# Patient Record
Sex: Male | Born: 1970 | Hispanic: Yes | Marital: Married | Smoking: Never smoker
Health system: Southern US, Community
[De-identification: ages and names within clinical notes are randomized; demographics above are authoritative.]

## PROBLEM LIST (undated history)

## (undated) DIAGNOSIS — Z8619 Personal history of other infectious and parasitic diseases: Secondary | ICD-10-CM

## (undated) DIAGNOSIS — I10 Essential (primary) hypertension: Secondary | ICD-10-CM

## (undated) DIAGNOSIS — E119 Type 2 diabetes mellitus without complications: Secondary | ICD-10-CM

## (undated) HISTORY — PX: KNEE SURGERY: SHX244

---

## 2014-01-01 ENCOUNTER — Ambulatory Visit: Payer: Self-pay | Admitting: Orthopedic Surgery

## 2018-09-24 ENCOUNTER — Other Ambulatory Visit: Payer: Self-pay | Admitting: Internal Medicine

## 2018-09-24 DIAGNOSIS — R1909 Other intra-abdominal and pelvic swelling, mass and lump: Secondary | ICD-10-CM

## 2018-09-24 DIAGNOSIS — N5089 Other specified disorders of the male genital organs: Secondary | ICD-10-CM

## 2018-09-25 ENCOUNTER — Other Ambulatory Visit: Payer: Self-pay

## 2018-09-25 ENCOUNTER — Other Ambulatory Visit: Payer: Self-pay | Admitting: Internal Medicine

## 2018-09-25 ENCOUNTER — Ambulatory Visit
Admission: RE | Admit: 2018-09-25 | Discharge: 2018-09-25 | Disposition: A | Discharge status: Home / Self Care | Payer: PRIVATE HEALTH INSURANCE | Source: Ambulatory Visit | Attending: Internal Medicine | Admitting: Internal Medicine

## 2018-09-25 DIAGNOSIS — N5089 Other specified disorders of the male genital organs: Secondary | ICD-10-CM | POA: Insufficient documentation

## 2018-09-25 DIAGNOSIS — R1909 Other intra-abdominal and pelvic swelling, mass and lump: Secondary | ICD-10-CM | POA: Insufficient documentation

## 2018-10-28 ENCOUNTER — Other Ambulatory Visit: Payer: Self-pay | Admitting: Internal Medicine

## 2018-10-28 DIAGNOSIS — R945 Abnormal results of liver function studies: Secondary | ICD-10-CM

## 2018-10-28 DIAGNOSIS — R7989 Other specified abnormal findings of blood chemistry: Secondary | ICD-10-CM

## 2018-12-09 ENCOUNTER — Other Ambulatory Visit: Payer: Self-pay

## 2018-12-09 ENCOUNTER — Ambulatory Visit
Admission: RE | Admit: 2018-12-09 | Discharge: 2018-12-09 | Disposition: A | Discharge status: Home / Self Care | Payer: PRIVATE HEALTH INSURANCE | Source: Ambulatory Visit | Attending: Internal Medicine | Admitting: Internal Medicine

## 2018-12-09 ENCOUNTER — Ambulatory Visit: Admission: RE | Admit: 2018-12-09 | Payer: PRIVATE HEALTH INSURANCE | Source: Ambulatory Visit

## 2018-12-09 DIAGNOSIS — R7989 Other specified abnormal findings of blood chemistry: Secondary | ICD-10-CM

## 2018-12-09 DIAGNOSIS — R945 Abnormal results of liver function studies: Secondary | ICD-10-CM

## 2019-01-05 ENCOUNTER — Emergency Department
Admission: EM | Admit: 2019-01-05 | Discharge: 2019-01-05 | Disposition: A | Discharge status: Home / Self Care | Payer: No Typology Code available for payment source | Attending: Student in an Organized Health Care Education/Training Program | Admitting: Student in an Organized Health Care Education/Training Program

## 2019-01-05 ENCOUNTER — Other Ambulatory Visit: Payer: Self-pay

## 2019-01-05 DIAGNOSIS — S0502XA Injury of conjunctiva and corneal abrasion without foreign body, left eye, initial encounter: Secondary | ICD-10-CM | POA: Insufficient documentation

## 2019-01-05 DIAGNOSIS — Y998 Other external cause status: Secondary | ICD-10-CM | POA: Insufficient documentation

## 2019-01-05 DIAGNOSIS — W228XXA Striking against or struck by other objects, initial encounter: Secondary | ICD-10-CM | POA: Insufficient documentation

## 2019-01-05 DIAGNOSIS — Y9389 Activity, other specified: Secondary | ICD-10-CM | POA: Insufficient documentation

## 2019-01-05 DIAGNOSIS — Y9289 Other specified places as the place of occurrence of the external cause: Secondary | ICD-10-CM | POA: Diagnosis not present

## 2019-01-05 DIAGNOSIS — S0592XA Unspecified injury of left eye and orbit, initial encounter: Secondary | ICD-10-CM | POA: Diagnosis present

## 2019-01-05 MED ORDER — FLUORESCEIN SODIUM 1 MG OP STRP
1.0000 | ORAL_STRIP | Freq: Once | OPHTHALMIC | Status: AC
  Administered 2019-01-05: 1 via OPHTHALMIC
  Filled 2019-01-05: qty 1

## 2019-01-05 MED ORDER — TETRACAINE HCL 0.5 % OP SOLN
2.0000 [drp] | Freq: Once | OPHTHALMIC | Status: AC
  Administered 2019-01-05: 2 [drp] via OPHTHALMIC
  Filled 2019-01-05: qty 4

## 2019-01-05 MED ORDER — POLYMYXIN B-TRIMETHOPRIM 10000-0.1 UNIT/ML-% OP SOLN: 1.0000 [drp] | OPHTHALMIC | 0 refills | Status: AC

## 2019-01-05 MED ORDER — KETOROLAC TROMETHAMINE 0.4 % OP SOLN: 1.0000 [drp] | Freq: Three times a day (TID) | OPHTHALMIC | 0 refills | Status: DC | PRN

## 2019-01-05 MED ORDER — POLYMYXIN B-TRIMETHOPRIM 10000-0.1 UNIT/ML-% OP SOLN
2.0000 [drp] | OPHTHALMIC | Status: DC
  Administered 2019-01-05: 2 [drp] via OPHTHALMIC
  Filled 2019-01-05: qty 10

## 2019-01-05 MED ORDER — KETOROLAC TROMETHAMINE 0.4 % OP SOLN: 1.0000 [drp] | Freq: Three times a day (TID) | OPHTHALMIC | 0 refills | Status: AC | PRN

## 2019-01-05 MED ORDER — POLYMYXIN B-TRIMETHOPRIM 10000-0.1 UNIT/ML-% OP SOLN: 1.0000 [drp] | OPHTHALMIC | 0 refills | Status: DC

## 2019-01-05 NOTE — ED Triage Notes (Signed)
Patient reports feels like something is in his left eye for the past 2 days.  Reports started after he was mowing.

## 2019-01-05 NOTE — ED Provider Notes (Signed)
[facility] Emergency Department Provider Note  ____________________________________________  Time seen: Approximately 8:04 AM  I have reviewed the triage vital signs and the nursing notes.   HISTORY  Chief Complaint Eye Problem    HPI Theodore Patel is a 48 y.o. male that presents to the emergency department for evaluation of eye irritation for 2 days.  Patient was mowing his lawn at the end of this week and he felt something fly into his eye.  He feels as if there is something in his eye.  Eye irritation is worse when his eyelid brushes up against his eye. Vision is a little blurry. He does not wear contacts.    He was also exposed to poison ivy this week, for which she is on oral medication and cream from primary care.   No past medical history on file.  There are no active problems to display for this patient.   Prior to Admission medications   Medication Sig Start Date End Date Taking? Authorizing Provider  ketorolac (ACULAR) 0.4 % SOLN Place 1 drop into the left eye 3 (three) times daily as needed. 01/05/19   [place] DerryWagner, Serita Degroote, PA-C  trimethoprim-polymyxin b (POLYTRIM) ophthalmic solution Place 1 drop into the left eye every 4 (four) hours. 01/05/19   [place] DerryWagner, Elaisha Zahniser, PA-C    Allergies Patient has no known allergies.  No family history on file.  Social History Social History   Tobacco Use  . Smoking status: Not on file  Substance Use Topics  . Alcohol use: Not on file  . Drug use: Not on file     Review of Systems  Respiratory: No SOB. Gastrointestinal: No nausea, no vomiting.  Musculoskeletal: Negative for musculoskeletal pain. Skin: Negative for rash, abrasions, lacerations, ecchymosis.   ____________________________________________   PHYSICAL EXAM:  VITAL SIGNS: ED Triage Vitals  Enc Vitals Group     BP 01/05/19 0638 (!) 152/92     Pulse Rate 01/05/19 0638 72     Resp 01/05/19 0638 18     Temp 01/05/19 0638 98.7 F (37.1 C)   Temp Source 01/05/19 0638 Oral     SpO2 01/05/19 0638 97 %     Weight 01/05/19 0636 210 lb (95.3 kg)     Height 01/05/19 0636 5\' 6"  (1.676 m)     Head Circumference --      Peak Flow --      Pain Score 01/05/19 0636 10     Pain Loc --      Pain Edu? --      Excl. in GC? --      Constitutional: Alert and oriented. Well appearing and in no acute distress. Eyes: Left conjunctiva is injected. PERRL. EOMI. circular 1 mm corneal defect to 7 PM position of left iris, not overlying the pupil. No indication for hordeolum, dacrocystitis, cellulitis, subconjunctival hemorrhage, corneal ulcer, hyphema, hypopyon. Head: Atraumatic. ENT:      Ears:      Nose: No congestion/rhinnorhea.      Mouth/Throat: Mucous membranes are moist.  Neck: No stridor. Cardiovascular: Normal rate, regular rhythm.  Good peripheral circulation. Respiratory: Normal respiratory effort without tachypnea or retractions. Lungs CTAB. Good air entry to the bases with no decreased or absent breath sounds. Musculoskeletal: Full range of motion to all extremities. No gross deformities appreciated. Neurologic:  Normal speech and language. No gross focal neurologic deficits are appreciated.  Skin:  Skin is warm, dry and intact. No rash noted. Psychiatric: Mood and affect are normal. Speech  and behavior are normal. Patient exhibits appropriate insight and judgement.   ____________________________________________   LABS (all labs ordered are listed, but only abnormal results are displayed)  Labs Reviewed - No data to display ____________________________________________  EKG   ____________________________________________  RADIOLOGY   No results found.  ____________________________________________    PROCEDURES  Procedure(s) performed:    Procedures    Medications  trimethoprim-polymyxin b (POLYTRIM) ophthalmic solution 2 drop (2 drops Left Eye Given 01/05/19 0839)  fluorescein ophthalmic strip 1 strip (1  strip Right Eye Given by Other 01/05/19 0836)  tetracaine (PONTOCAINE) 0.5 % ophthalmic solution 2 drop (2 drops Right Eye Given by Other 01/05/19 0836)     ____________________________________________   INITIAL IMPRESSION / ASSESSMENT AND PLAN / ED COURSE  Pertinent labs & imaging results that were available during my care of the patient were reviewed by me and considered in my medical decision making (see chart for details).  Review of the [place] CSRS was performed in accordance of the [place] prior to dispensing any controlled drugs.   Patient's diagnosis is consistent with corneal abrasion.  Vital signs and exam are reassuring.  Patient does have a corneal abrasion as seen on fluorescein stain.  Patient will be discharged home with prescriptions for polytrim. Patient is to follow up with opthamology as directed.  Referral was given to Dr. Neville Route.  Patient is agreeable to call today or Monday.  Patient is given ED precautions to return to the ED for any worsening or new symptoms.     ____________________________________________  FINAL CLINICAL IMPRESSION(S) / ED DIAGNOSES  Final diagnoses:  Abrasion of left cornea, initial encounter      NEW MEDICATIONS STARTED DURING THIS VISIT:  ED Discharge Orders         Ordered    trimethoprim-polymyxin b (POLYTRIM) ophthalmic solution  Every 4 hours     01/05/19 0830    ketorolac (ACULAR) 0.4 % SOLN  3 times daily PRN     01/05/19 0832              This chart was dictated using voice recognition software/Dragon. Despite best efforts to proofread, errors can occur which can change the meaning. Any change was purely unintentional.    Laban Emperor, PA-C 01/05/19 1539    Merlyn Lot, MD 01/05/19 [phone]

## 2019-01-05 NOTE — Discharge Instructions (Signed)
You have a scratch to your eye.  Please apply Polytrim eyedrops, 1 drop to your eye every 4 hours.  You can use Acular eye drops for pain.  Lease call Dr. Neville Route today or Monday for an appointment on Monday.

## 2019-01-05 NOTE — ED Notes (Signed)
NAD noted at time of D/C. Pt denies questions or concerns. Pt ambulatory to the lobby at this time.  

## 2019-04-04 ENCOUNTER — Other Ambulatory Visit: Payer: Self-pay | Admitting: Internal Medicine

## 2019-04-04 DIAGNOSIS — Z20822 Contact with and (suspected) exposure to covid-19: Secondary | ICD-10-CM

## 2019-04-05 LAB — NOVEL CORONAVIRUS, NAA: SARS-CoV-2, NAA: NOT DETECTED

## 2019-04-07 ENCOUNTER — Telehealth: Payer: Self-pay | Admitting: General Practice

## 2019-10-22 ENCOUNTER — Ambulatory Visit
Admission: RE | Admit: 2019-10-22 | Discharge: 2019-10-22 | Disposition: A | Discharge status: Home / Self Care | Payer: No Typology Code available for payment source | Source: Ambulatory Visit | Attending: Family Medicine | Admitting: Family Medicine

## 2019-10-22 ENCOUNTER — Other Ambulatory Visit: Payer: Self-pay | Admitting: Family Medicine

## 2019-10-22 ENCOUNTER — Other Ambulatory Visit: Payer: Self-pay

## 2019-10-22 DIAGNOSIS — R002 Palpitations: Secondary | ICD-10-CM | POA: Insufficient documentation

## 2019-10-22 DIAGNOSIS — R531 Weakness: Secondary | ICD-10-CM | POA: Diagnosis present

## 2019-10-22 DIAGNOSIS — R0789 Other chest pain: Secondary | ICD-10-CM | POA: Insufficient documentation

## 2019-10-22 DIAGNOSIS — R42 Dizziness and giddiness: Secondary | ICD-10-CM

## 2019-10-22 HISTORY — DX: Type 2 diabetes mellitus without complications: E11.9

## 2019-10-22 HISTORY — DX: Essential (primary) hypertension: I10

## 2019-10-22 MED ORDER — IOHEXOL 350 MG/ML SOLN
75.0000 mL | Freq: Once | INTRAVENOUS | Status: AC | PRN
  Administered 2019-10-22: 75 mL via INTRAVENOUS

## 2020-01-10 ENCOUNTER — Other Ambulatory Visit
Admission: RE | Admit: 2020-01-10 | Discharge: 2020-01-10 | Disposition: A | Discharge status: Home / Self Care | Payer: No Typology Code available for payment source | Source: Ambulatory Visit | Attending: Internal Medicine | Admitting: Internal Medicine

## 2020-01-10 DIAGNOSIS — R21 Rash and other nonspecific skin eruption: Secondary | ICD-10-CM | POA: Diagnosis present

## 2020-01-10 DIAGNOSIS — R58 Hemorrhage, not elsewhere classified: Secondary | ICD-10-CM | POA: Insufficient documentation

## 2020-01-10 LAB — PROTIME-INR
INR: 0.9 (ref 0.8–1.2)
Prothrombin Time: 11.9 seconds (ref 11.4–15.2)

## 2020-09-22 ENCOUNTER — Ambulatory Visit: Payer: PRIVATE HEALTH INSURANCE | Admitting: Dermatology

## 2021-01-20 ENCOUNTER — Encounter: Payer: Self-pay | Admitting: *Deleted

## 2021-01-21 ENCOUNTER — Other Ambulatory Visit: Payer: Self-pay

## 2021-01-21 ENCOUNTER — Encounter
Admission: RE | Disposition: A | Discharge status: Home / Self Care | Payer: Self-pay | Source: Home / Self Care | Attending: Gastroenterology

## 2021-01-21 ENCOUNTER — Ambulatory Visit: Payer: PRIVATE HEALTH INSURANCE | Admitting: Anesthesiology

## 2021-01-21 ENCOUNTER — Ambulatory Visit
Admission: RE | Admit: 2021-01-21 | Discharge: 2021-01-21 | Disposition: A | Discharge status: Home / Self Care | Payer: PRIVATE HEALTH INSURANCE | Attending: Gastroenterology | Admitting: Gastroenterology

## 2021-01-21 ENCOUNTER — Encounter: Payer: Self-pay | Admitting: *Deleted

## 2021-01-21 DIAGNOSIS — E119 Type 2 diabetes mellitus without complications: Secondary | ICD-10-CM | POA: Diagnosis not present

## 2021-01-21 DIAGNOSIS — E785 Hyperlipidemia, unspecified: Secondary | ICD-10-CM | POA: Diagnosis not present

## 2021-01-21 DIAGNOSIS — K640 First degree hemorrhoids: Secondary | ICD-10-CM | POA: Diagnosis not present

## 2021-01-21 DIAGNOSIS — Z7984 Long term (current) use of oral hypoglycemic drugs: Secondary | ICD-10-CM | POA: Diagnosis not present

## 2021-01-21 DIAGNOSIS — Z1211 Encounter for screening for malignant neoplasm of colon: Secondary | ICD-10-CM | POA: Diagnosis not present

## 2021-01-21 DIAGNOSIS — Z79899 Other long term (current) drug therapy: Secondary | ICD-10-CM | POA: Insufficient documentation

## 2021-01-21 DIAGNOSIS — I10 Essential (primary) hypertension: Secondary | ICD-10-CM | POA: Diagnosis not present

## 2021-01-21 HISTORY — PX: COLONOSCOPY WITH PROPOFOL: SHX5780

## 2021-01-21 HISTORY — DX: Personal history of other infectious and parasitic diseases: Z86.19

## 2021-01-21 LAB — GLUCOSE, CAPILLARY: Glucose-Capillary: 123 mg/dL — ABNORMAL HIGH (ref 70–99)

## 2021-01-21 SURGERY — COLONOSCOPY WITH PROPOFOL
Anesthesia: General

## 2021-01-21 MED ORDER — PROPOFOL 500 MG/50ML IV EMUL
INTRAVENOUS | Status: AC
  Filled 2021-01-21: qty 50

## 2021-01-21 MED ORDER — PROPOFOL 500 MG/50ML IV EMUL
INTRAVENOUS | Status: DC | PRN
  Administered 2021-01-21: 200 ug/kg/min via INTRAVENOUS

## 2021-01-21 MED ORDER — LIDOCAINE HCL (CARDIAC) PF 100 MG/5ML IV SOSY
PREFILLED_SYRINGE | INTRAVENOUS | Status: DC | PRN
  Administered 2021-01-21: 50 mg via INTRAVENOUS

## 2021-01-21 MED ORDER — SODIUM CHLORIDE 0.9 % IV SOLN: INTRAVENOUS | Status: DC

## 2021-01-21 MED ORDER — PROPOFOL 10 MG/ML IV BOLUS
INTRAVENOUS | Status: DC | PRN
  Administered 2021-01-21: 100 mg via INTRAVENOUS

## 2021-01-21 MED ORDER — PROPOFOL 10 MG/ML IV BOLUS: INTRAVENOUS | Status: DC | PRN

## 2021-01-21 NOTE — Transfer of Care (Signed)
Immediate Anesthesia Transfer of Care Note  Patient: Theodore Patel  Procedure(s) Performed: COLONOSCOPY WITH PROPOFOL  Patient Location: PACU and Endoscopy Unit  Anesthesia Type:MAC  Level of Consciousness: sedated  Airway & Oxygen Therapy: Patient Spontanous Breathing and Patient connected to nasal cannula oxygen  Post-op Assessment: Report given to RN and Post -op Vital signs reviewed and stable  Post vital signs: Reviewed and stable  Last Vitals:  Vitals Value Taken Time  BP 113/74 01/21/21 0840  Temp    Pulse 77 01/21/21 0844  Resp 16 01/21/21 0844  SpO2 98 % 01/21/21 0844  Vitals shown include unvalidated device data.  Last Pain:  Vitals:   01/21/21 0840  TempSrc:   PainSc: 0-No pain         Complications: No notable events documented.

## 2021-01-21 NOTE — Anesthesia Preprocedure Evaluation (Signed)
Anesthesia Evaluation  Patient identified by MRN, date of birth, ID band Patient awake    Reviewed: Allergy & Precautions, NPO status , Patient's Chart, lab work & pertinent test results  History of Anesthesia Complications Negative for: history of anesthetic complications  Airway Mallampati: III  TM Distance: <3 FB Neck ROM: full    Dental  (+) Chipped, Poor Dentition, Missing   Pulmonary neg pulmonary ROS, neg shortness of breath,    Pulmonary exam normal        Cardiovascular Exercise Tolerance: Good hypertension, (-) anginaNormal cardiovascular exam     Neuro/Psych negative neurological ROS  negative psych ROS   GI/Hepatic negative GI ROS, Neg liver ROS, neg GERD  ,  Endo/Other  diabetes, Type 2  Renal/GU negative Renal ROS  negative genitourinary   Musculoskeletal   Abdominal   Peds  Hematology negative hematology ROS (+)   Anesthesia Other Findings Past Medical History: No date: Diabetes mellitus without complication (HCC) No date: History of chicken pox No date: Hypertension  Past Surgical History: No date: KNEE SURGERY; Left  BMI    Body Mass Index: 35.19 kg/m      Reproductive/Obstetrics negative OB ROS                             Anesthesia Physical Anesthesia Plan  ASA: 3  Anesthesia Plan: General   Post-op Pain Management:    Induction: Intravenous  PONV Risk Score and Plan: Propofol infusion and TIVA  Airway Management Planned: Natural Airway and Nasal Cannula  Additional Equipment:   Intra-op Plan:   Post-operative Plan:   Informed Consent: I have reviewed the patients History and Physical, chart, labs and discussed the procedure including the risks, benefits and alternatives for the proposed anesthesia with the patient or authorized representative who has indicated his/her understanding and acceptance.     Dental Advisory Given  Plan Discussed  with: Anesthesiologist, CRNA and Surgeon  Anesthesia Plan Comments: (Patient declined interpreter   Patient consented for risks of anesthesia including but not limited to:  - adverse reactions to medications - risk of airway placement if required - damage to eyes, teeth, lips or other oral mucosa - nerve damage due to positioning  - sore throat or hoarseness - Damage to heart, brain, nerves, lungs, other parts of body or loss of life  Patient voiced understanding.)        Anesthesia Quick Evaluation

## 2021-01-21 NOTE — Interval H&P Note (Signed)
History and Physical Interval Note:  01/21/2021 8:14 AM  Theodore Patel  has presented today for surgery, with the diagnosis of SCREENING.  The various methods of treatment have been discussed with the patient and family. After consideration of risks, benefits and other options for treatment, the patient has consented to  Procedure(s) with comments: COLONOSCOPY WITH PROPOFOL (N/A) - DM as a surgical intervention.  The patient's history has been reviewed, patient examined, no change in status, stable for surgery.  I have reviewed the patient's chart and labs.  Questions were answered to the patient's satisfaction.     Regis Bill  Ok to proceed with colonoscopy

## 2021-01-21 NOTE — Addendum Note (Signed)
Addendum  created 01/21/21 1213 by Milagros Reap, CRNA   Charge Capture section accepted

## 2021-01-21 NOTE — Anesthesia Postprocedure Evaluation (Signed)
Anesthesia Post Note  Patient: Theodore Patel  Procedure(s) Performed: COLONOSCOPY WITH PROPOFOL  Patient location during evaluation: Endoscopy Anesthesia Type: General Level of consciousness: awake and alert Pain management: pain level controlled Vital Signs Assessment: post-procedure vital signs reviewed and stable Respiratory status: spontaneous breathing, nonlabored ventilation, respiratory function stable and patient connected to nasal cannula oxygen Cardiovascular status: blood pressure returned to baseline and stable Postop Assessment: no apparent nausea or vomiting Anesthetic complications: no   No notable events documented.   Last Vitals:  Vitals:   01/21/21 0850 01/21/21 0900  BP: 109/78 119/85  Pulse: 78 68  Resp: 18 20  Temp:    SpO2: 96% 97%    Last Pain:  Vitals:   01/21/21 0900  TempSrc:   PainSc: 0-No pain                 Cleda Mccreedy Charliee Krenz

## 2021-01-21 NOTE — Op Note (Signed)
[facility] Gastroenterology Patient Name: Theodore Patel Procedure Date: 01/21/2021 8:19 AM MRN: [id number] Account #: [id number] Date of Birth: March 02, 1971 Admit Type: Outpatient Age: 50 Room: [facility] ENDO ROOM 3 Gender: Male Note Status: Finalized Procedure:             Colonoscopy Indications:           Screening for colorectal malignant neoplasm Providers:             Andrey Farmer MD, MD Referring MD:          Tracie Harrier, MD (Referring MD) Medicines:             Monitored Anesthesia Care Complications:         No immediate complications. Procedure:             Pre-Anesthesia Assessment:                        - Prior to the procedure, a History and Physical was                         performed, and patient medications and allergies were                         reviewed. The patient is competent. The risks and                         benefits of the procedure and the sedation options and                         risks were discussed with the patient. All questions                         were answered and informed consent was obtained.                         Patient identification and proposed procedure were                         verified by the physician, the nurse, the anesthetist                         and the technician in the endoscopy suite. Mental                         Status Examination: alert and oriented. Airway                         Examination: normal oropharyngeal airway and neck                         mobility. Respiratory Examination: clear to                         auscultation. CV Examination: normal. Prophylactic                         Antibiotics: The patient does not require prophylactic  antibiotics. Prior Anticoagulants: The patient has                         taken no previous anticoagulant or antiplatelet                         agents. ASA Grade Assessment: II - A patient with mild                          systemic disease. After reviewing the risks and                         benefits, the patient was deemed in satisfactory                         condition to undergo the procedure. The anesthesia                         plan was to use monitored anesthesia care (MAC).                         Immediately prior to administration of medications,                         the patient was re-assessed for adequacy to receive                         sedatives. The heart rate, respiratory rate, oxygen                         saturations, blood pressure, adequacy of pulmonary                         ventilation, and response to care were monitored                         throughout the procedure. The physical status of the                         patient was re-assessed after the procedure.                        After obtaining informed consent, the colonoscope was                         passed under direct vision. Throughout the procedure,                         the patient's blood pressure, pulse, and oxygen                         saturations were monitored continuously. The                         Colonoscope was introduced through the anus and                         advanced to the the cecum, identified by appendiceal  orifice and ileocecal valve. The colonoscopy was                         performed without difficulty. The patient tolerated                         the procedure well. The quality of the bowel                         preparation was fair except the ascending colon was                         poor. Findings:      The perianal and digital rectal examinations were normal.      Internal hemorrhoids were found during retroflexion. The hemorrhoids       were Grade I (internal hemorrhoids that do not prolapse).      The exam was otherwise without abnormality on direct and retroflexion       views. Impression:            - Internal  hemorrhoids.                        - The examination was otherwise normal on direct and                         retroflexion views.                        - No specimens collected. Recommendation:        - Discharge patient to home.                        - Resume previous diet.                        - Continue present medications.                        - Repeat colonoscopy in 6 months because the bowel                         preparation was suboptimal.                        - Return to referring physician as previously                         scheduled. Procedure Code(s):     --- Professional ---                        F6213, Colorectal cancer screening; colonoscopy on                         individual not meeting criteria for high risk Diagnosis Code(s):     --- Professional ---                        Z12.11, Encounter for screening for malignant neoplasm  of colon                        K64.0, First degree hemorrhoids CPT copyright 2019 American Medical Association. All rights reserved. The codes documented in this report are preliminary and upon coder review may  be revised to meet current compliance requirements. Andrey Farmer MD, MD 01/21/2021 8:37:40 AM Number of Addenda: 0 Note Initiated On: 01/21/2021 8:19 AM Scope Withdrawal Time: 0 hours 3 minutes 59 seconds  Total Procedure Duration: 0 hours 8 minutes 23 seconds  Estimated Blood Loss:  Estimated blood loss: none.      [facility]

## 2021-01-21 NOTE — H&P (Signed)
Outpatient short stay form Pre-procedure 01/21/2021 8:11 AM Merlyn Lot MD, MPH  Primary Physician: Dr. Marcello Fennel  Reason for visit:  Screening colonoscopy  History of present illness:   50 y/o gentleman with history of hypertension, hld, and DM II here for screening colonoscopy. No family history of GI malignancies. No blood thinners. No abdominal surgeries.    Current Facility-Administered Medications:    0.9 %  sodium chloride infusion, , Intravenous, Continuous, Vida Nicol, Rossie Muskrat, MD, Last Rate: 20 mL/hr at 01/21/21 0752, New Bag at 01/21/21 0752  Medications Prior to Admission  Medication Sig Dispense Refill Last Dose   atorvastatin (LIPITOR) 10 MG tablet Take 10 mg by mouth daily.   Past Week   fenofibrate (TRICOR) 48 MG tablet Take 48 mg by mouth daily.   Past Week   hydrochlorothiazide (HYDRODIURIL) 12.5 MG tablet Take 12.5 mg by mouth daily.   Past Week   ketorolac (ACULAR) 0.4 % SOLN Place 1 drop into the left eye 3 (three) times daily as needed. 5 mL 0 Past Week   metFORMIN (GLUCOPHAGE) 500 MG tablet Take 500 mg by mouth 2 (two) times daily with a meal.   Past Week   trimethoprim-polymyxin b (POLYTRIM) ophthalmic solution Place 1 drop into the left eye every 4 (four) hours. 10 mL 0 Past Week     No Known Allergies   Past Medical History:  Diagnosis Date   Diabetes mellitus without complication (HCC)    History of chicken pox    Hypertension     Review of systems:  Otherwise negative.    Physical Exam  Gen: Alert, oriented. Appears stated age.  HEENT:PERRLA. Lungs: No respiratory distress CV: RRR Abd: soft, benign, no masses Ext: No edema    Planned procedures: Proceed with colonoscopy. The patient understands the nature of the planned procedure, indications, risks, alternatives and potential complications including but not limited to bleeding, infection, perforation, damage to internal organs and possible oversedation/side effects from anesthesia. The  patient agrees and gives consent to proceed.  Please refer to procedure notes for findings, recommendations and patient disposition/instructions.     Merlyn Lot MD, MPH Gastroenterology 01/21/2021  8:11 AM

## 2021-01-24 ENCOUNTER — Encounter: Payer: Self-pay | Admitting: Gastroenterology

## 2021-07-29 ENCOUNTER — Encounter: Admission: RE | Payer: Self-pay | Source: Home / Self Care

## 2021-07-29 ENCOUNTER — Ambulatory Visit: Admission: RE | Admit: 2021-07-29 | Payer: No Typology Code available for payment source | Source: Home / Self Care

## 2021-07-29 SURGERY — COLONOSCOPY WITH PROPOFOL
Anesthesia: General

## 2021-09-09 ENCOUNTER — Other Ambulatory Visit: Payer: Self-pay | Admitting: Gastroenterology

## 2021-09-09 DIAGNOSIS — R748 Abnormal levels of other serum enzymes: Secondary | ICD-10-CM

## 2021-09-14 ENCOUNTER — Ambulatory Visit
Admission: RE | Admit: 2021-09-14 | Discharge: 2021-09-14 | Disposition: A | Discharge status: Home / Self Care | Payer: BC Managed Care – PPO | Source: Ambulatory Visit | Attending: Gastroenterology | Admitting: Gastroenterology

## 2021-09-14 ENCOUNTER — Other Ambulatory Visit: Payer: Self-pay

## 2021-09-14 DIAGNOSIS — R748 Abnormal levels of other serum enzymes: Secondary | ICD-10-CM

## 2022-06-09 DIAGNOSIS — Z419 Encounter for procedure for purposes other than remedying health state, unspecified: Secondary | ICD-10-CM | POA: Diagnosis not present

## 2022-06-19 ENCOUNTER — Encounter: Payer: Self-pay | Admitting: Emergency Medicine

## 2022-06-19 DIAGNOSIS — E1165 Type 2 diabetes mellitus with hyperglycemia: Secondary | ICD-10-CM | POA: Diagnosis not present

## 2022-06-19 DIAGNOSIS — K5900 Constipation, unspecified: Secondary | ICD-10-CM | POA: Diagnosis not present

## 2022-06-19 DIAGNOSIS — I10 Essential (primary) hypertension: Secondary | ICD-10-CM | POA: Diagnosis not present

## 2022-06-19 DIAGNOSIS — R634 Abnormal weight loss: Secondary | ICD-10-CM | POA: Diagnosis not present

## 2022-06-19 DIAGNOSIS — R739 Hyperglycemia, unspecified: Secondary | ICD-10-CM | POA: Diagnosis not present

## 2022-06-19 DIAGNOSIS — R350 Frequency of micturition: Secondary | ICD-10-CM | POA: Diagnosis not present

## 2022-06-19 DIAGNOSIS — E782 Mixed hyperlipidemia: Secondary | ICD-10-CM | POA: Diagnosis not present

## 2022-06-19 LAB — CBC WITH DIFFERENTIAL/PLATELET
Abs Immature Granulocytes: 0.02 10*3/uL (ref 0.00–0.07)
Basophils Absolute: 0.1 10*3/uL (ref 0.0–0.1)
Basophils Relative: 1 %
Eosinophils Absolute: 0.1 10*3/uL (ref 0.0–0.5)
Eosinophils Relative: 2 %
HCT: 49.1 % (ref 39.0–52.0)
Hemoglobin: 16.3 g/dL (ref 13.0–17.0)
Immature Granulocytes: 0 %
Lymphocytes Relative: 51 %
Lymphs Abs: 3.6 10*3/uL (ref 0.7–4.0)
MCH: 27.1 pg (ref 26.0–34.0)
MCHC: 33.2 g/dL (ref 30.0–36.0)
MCV: 81.6 fL (ref 80.0–100.0)
Monocytes Absolute: 0.6 10*3/uL (ref 0.1–1.0)
Monocytes Relative: 9 %
Neutro Abs: 2.6 10*3/uL (ref 1.7–7.7)
Neutrophils Relative %: 37 %
Platelets: 230 10*3/uL (ref 150–400)
RBC: 6.02 MIL/uL — ABNORMAL HIGH (ref 4.22–5.81)
RDW: 12.9 % (ref 11.5–15.5)
WBC: 7 10*3/uL (ref 4.0–10.5)
nRBC: 0 % (ref 0.0–0.2)

## 2022-06-19 LAB — URINALYSIS, ROUTINE W REFLEX MICROSCOPIC
Bacteria, UA: NONE SEEN
Bilirubin Urine: NEGATIVE
Glucose, UA: >=500 mg/dL — AB
Hgb urine dipstick: NEGATIVE
Ketones, ur: 20 mg/dL — AB
Leukocytes,Ua: NEGATIVE
Nitrite: NEGATIVE
Protein, ur: NEGATIVE mg/dL
Specific Gravity, Urine: 1.029 (ref 1.005–1.030)
pH: 5 (ref 5.0–8.0)

## 2022-06-19 LAB — COMPREHENSIVE METABOLIC PANEL
ALT: 160 U/L — ABNORMAL HIGH (ref 0–44)
AST: 99 U/L — ABNORMAL HIGH (ref 15–41)
Albumin: 4.3 g/dL (ref 3.5–5.0)
Alkaline Phosphatase: 117 U/L (ref 38–126)
Anion gap: 11 (ref 5–15)
BUN: 12 mg/dL (ref 6–20)
CO2: 25 mmol/L (ref 22–32)
Calcium: 9.5 mg/dL (ref 8.9–10.3)
Chloride: 97 mmol/L — ABNORMAL LOW (ref 98–111)
Creatinine, Ser: 0.67 mg/dL (ref 0.61–1.24)
GFR, Estimated: >60 mL/min (ref >60)
Glucose, Bld: 364 mg/dL — ABNORMAL HIGH (ref 70–99)
Potassium: 3.8 mmol/L (ref 3.5–5.1)
Sodium: 133 mmol/L — ABNORMAL LOW (ref 135–145)
Total Bilirubin: 0.9 mg/dL (ref 0.3–1.2)
Total Protein: 7.4 g/dL (ref 6.5–8.1)

## 2022-06-19 LAB — CBG MONITORING, ED
Glucose-Capillary: 412 mg/dL — ABNORMAL HIGH (ref 70–99)
Glucose-Capillary: 342 mg/dL — ABNORMAL HIGH (ref 70–99)

## 2022-06-19 MED ORDER — SODIUM CHLORIDE 0.9 % IV BOLUS
1000.0000 mL | Freq: Once | INTRAVENOUS | Status: AC
  Administered 2022-06-19: 1000 mL via INTRAVENOUS

## 2022-06-19 NOTE — ED Provider Triage Note (Signed)
Emergency Medicine Provider Triage Evaluation Note  Theodore Patel , a 51 y.o. male  was evaluated in triage.  Pt complains of hyperglycemia. Patient from PCP with elevated glucose. Patient has been pre-diabetic and on metformin. Patient stopped metformin on his own a month ago. Cbg 511 at PCP. No other symptoms  Review of Systems  Positive: hyperglycemia Negative: Cp, shob, abd pain, nausea, vomiting  Physical Exam  Ht 5\' 6"  (1.676 m)   Wt 91.6 kg   BMI 32.60 kg/m  Gen:   Awake, no distress   Resp:  Normal effort  MSK:   Moves extremities without difficulty  Other:    Medical Decision Making  Medically screening exam initiated at 7:38 PM.  Appropriate orders placed.  Theodore Patel was informed that the remainder of the evaluation will be completed by another provider, this initial triage assessment does not replace that evaluation, and the importance of remaining in the ED until their evaluation is complete.  Labs, fluids   Earnest Conroy, Racheal Patches 06/19/22 1938

## 2022-06-19 NOTE — ED Triage Notes (Signed)
Pt presents via POV with complaints of hyperglycemia - at his PCP he was told he was prediabetic and was prescribed metformin which the patient stopped taking 3 months ago. Pts glucose in lab work today was 583 at his PCP. Denies dizziness, excess thirst, polyuria, CP, or SOB.  CBG in triage - 412.

## 2022-06-20 ENCOUNTER — Emergency Department
Admission: EM | Admit: 2022-06-20 | Discharge: 2022-06-20 | Disposition: A | Discharge status: Home / Self Care | Payer: Medicaid Other | Attending: Emergency Medicine | Admitting: Emergency Medicine

## 2022-06-20 DIAGNOSIS — R739 Hyperglycemia, unspecified: Secondary | ICD-10-CM

## 2022-06-20 LAB — CBG MONITORING, ED: Glucose-Capillary: 309 mg/dL — ABNORMAL HIGH (ref 70–99)

## 2022-06-20 NOTE — Discharge Instructions (Signed)
Take your medications as prescribed and follow-up with your regular doctor this week.

## 2022-06-20 NOTE — ED Provider Notes (Signed)
[facility] Provider Note    Event Date/Time   First MD Initiated Contact with Patient 06/20/22 0101     (approximate)   History   Hyperglycemia   HPI  Horatio Bertz is a 51 y.o. male   Past medical history of diabetes, hypertension Diabetic who lost insurance so stopped taking his metformin who presents to the emergency department with hyperglycemia.  No other symptoms.  He was told by his primary care doctor to come to the emergency department because of high blood sugar.  Got insurance again so he has his medications and will refill tomorrow. He reports no other medical complaints and has otherwise been feeling in his regular state of health.   History was obtained via the patient.  Collateral information obtained by independent story and his wife who is at bedside. I reviewed external medical notes including an office visit from internal medicine dated 06/19/2022 noting polyuria.      Physical Exam   Triage Vital Signs: ED Triage Vitals  Enc Vitals Group     BP 06/19/22 1940 133/78     Pulse Rate 06/19/22 1940 82     Resp 06/19/22 1940 18     Temp 06/19/22 1940 98.3 F (36.8 C)     Temp Source 06/19/22 1940 Oral     SpO2 06/19/22 1940 100 %     Weight 06/19/22 1934 202 lb (91.6 kg)     Height 06/19/22 1934 5\' 6"  (1.676 m)     Head Circumference --      Peak Flow --      Pain Score --      Pain Loc --      Pain Edu? --      Excl. in GC? --     Most recent vital signs: Vitals:   06/19/22 2336 06/20/22 0124  BP: (!) 141/85 (!) 141/85  Pulse: 70 70  Resp: 18 14  Temp: 98 F (36.7 C) 98 F (36.7 C)  SpO2: 97% 98%    General: Awake, no distress.  CV:  Good peripheral perfusion.  Resp:  Normal effort.  Abd:  No distention.  Other:  Abd Is soft and nontender and he is awake alert oriented and pleasant   ED Results / Procedures / Treatments   Labs (all labs ordered are listed, but only abnormal results are  displayed) Labs Reviewed  URINALYSIS, ROUTINE W REFLEX MICROSCOPIC - Abnormal; Notable for the following components:      Result Value   Color, Urine STRAW (*)    APPearance CLEAR (*)    Glucose, UA >=500 (*)    Ketones, ur 20 (*)    All other components within normal limits  COMPREHENSIVE METABOLIC PANEL - Abnormal; Notable for the following components:   Sodium 133 (*)    Chloride 97 (*)    Glucose, Bld 364 (*)    AST 99 (*)    ALT 160 (*)    All other components within normal limits  CBC WITH DIFFERENTIAL/PLATELET - Abnormal; Notable for the following components:   RBC 6.02 (*)    All other components within normal limits  CBG MONITORING, ED - Abnormal; Notable for the following components:   Glucose-Capillary 412 (*)    All other components within normal limits  CBG MONITORING, ED - Abnormal; Notable for the following components:   Glucose-Capillary 342 (*)    All other components within normal limits  CBG MONITORING, ED - Abnormal; Notable for  the following components:   Glucose-Capillary 309 (*)    All other components within normal limits     I reviewed labs and they are notable for glucose originally in the 400s but better after normal saline bolus to 300.  He has elevated AST and ALT in the 100s.   PROCEDURES:  Critical Care performed: No  Procedures   MEDICATIONS ORDERED IN ED: Medications  sodium chloride 0.9 % bolus 1,000 mL (0 mLs Intravenous Stopped 06/19/22 2041)    IMPRESSION / MDM / ASSESSMENT AND PLAN / ED COURSE  I reviewed the triage vital signs and the nursing notes.                              Differential diagnosis includes, but is not limited to, hyperglycemia, DKA, infection   MDM: Asymptomatic patient with hyperglycemia in the setting of not having his medications.  He now has his medications.  He is willing to take them.  He plans to take them.  He has no other medical complaints and has mildly elevated LFTs and a benign abdominal  exam.  He has had polyuria and some weight loss, likely attributable to his improperly managed hyperglycemia.  He is not in DKA today.  With benign exam and no other complaints, he will discharge and continue taking his medications and follow-up with his diabetes doctor.   Patient's presentation is most consistent with acute presentation with potential threat to life or bodily function.       FINAL CLINICAL IMPRESSION(S) / ED DIAGNOSES   Final diagnoses:  Hyperglycemia     Rx / DC Orders   ED Discharge Orders     None        Note:  This document was prepared using Dragon voice recognition software and may include unintentional dictation errors.    Lucillie Garfinkel, MD 06/20/22 [phone]

## 2022-07-10 DIAGNOSIS — Z419 Encounter for procedure for purposes other than remedying health state, unspecified: Secondary | ICD-10-CM | POA: Diagnosis not present

## 2022-08-10 DIAGNOSIS — Z419 Encounter for procedure for purposes other than remedying health state, unspecified: Secondary | ICD-10-CM | POA: Diagnosis not present

## 2022-09-08 DIAGNOSIS — Z419 Encounter for procedure for purposes other than remedying health state, unspecified: Secondary | ICD-10-CM | POA: Diagnosis not present

## 2022-09-14 DIAGNOSIS — K5900 Constipation, unspecified: Secondary | ICD-10-CM | POA: Diagnosis not present

## 2022-09-14 DIAGNOSIS — E7800 Pure hypercholesterolemia, unspecified: Secondary | ICD-10-CM | POA: Diagnosis not present

## 2022-09-14 DIAGNOSIS — E1165 Type 2 diabetes mellitus with hyperglycemia: Secondary | ICD-10-CM | POA: Diagnosis not present

## 2022-09-14 DIAGNOSIS — E782 Mixed hyperlipidemia: Secondary | ICD-10-CM | POA: Diagnosis not present

## 2022-09-14 DIAGNOSIS — R7989 Other specified abnormal findings of blood chemistry: Secondary | ICD-10-CM | POA: Diagnosis not present

## 2022-09-14 DIAGNOSIS — I10 Essential (primary) hypertension: Secondary | ICD-10-CM | POA: Diagnosis not present

## 2022-09-14 DIAGNOSIS — E786 Lipoprotein deficiency: Secondary | ICD-10-CM | POA: Diagnosis not present

## 2022-09-14 DIAGNOSIS — Z125 Encounter for screening for malignant neoplasm of prostate: Secondary | ICD-10-CM | POA: Diagnosis not present

## 2022-09-14 DIAGNOSIS — Z0000 Encounter for general adult medical examination without abnormal findings: Secondary | ICD-10-CM | POA: Diagnosis not present

## 2022-10-09 DIAGNOSIS — Z419 Encounter for procedure for purposes other than remedying health state, unspecified: Secondary | ICD-10-CM | POA: Diagnosis not present

## 2022-11-08 DIAGNOSIS — Z419 Encounter for procedure for purposes other than remedying health state, unspecified: Secondary | ICD-10-CM | POA: Diagnosis not present

## 2022-11-22 DIAGNOSIS — H5213 Myopia, bilateral: Secondary | ICD-10-CM | POA: Diagnosis not present

## 2022-11-24 DIAGNOSIS — H524 Presbyopia: Secondary | ICD-10-CM | POA: Diagnosis not present

## 2022-12-09 DIAGNOSIS — Z419 Encounter for procedure for purposes other than remedying health state, unspecified: Secondary | ICD-10-CM | POA: Diagnosis not present

## 2022-12-18 DIAGNOSIS — L239 Allergic contact dermatitis, unspecified cause: Secondary | ICD-10-CM | POA: Diagnosis not present

## 2023-01-08 DIAGNOSIS — Z419 Encounter for procedure for purposes other than remedying health state, unspecified: Secondary | ICD-10-CM | POA: Diagnosis not present

## 2023-02-08 DIAGNOSIS — Z419 Encounter for procedure for purposes other than remedying health state, unspecified: Secondary | ICD-10-CM | POA: Diagnosis not present

## 2023-03-09 DIAGNOSIS — E7800 Pure hypercholesterolemia, unspecified: Secondary | ICD-10-CM | POA: Diagnosis not present

## 2023-03-09 DIAGNOSIS — Z0000 Encounter for general adult medical examination without abnormal findings: Secondary | ICD-10-CM | POA: Diagnosis not present

## 2023-03-09 DIAGNOSIS — R7989 Other specified abnormal findings of blood chemistry: Secondary | ICD-10-CM | POA: Diagnosis not present

## 2023-03-09 DIAGNOSIS — E786 Lipoprotein deficiency: Secondary | ICD-10-CM | POA: Diagnosis not present

## 2023-03-09 DIAGNOSIS — Z6831 Body mass index (BMI) 31.0-31.9, adult: Secondary | ICD-10-CM | POA: Diagnosis not present

## 2023-03-09 DIAGNOSIS — E1165 Type 2 diabetes mellitus with hyperglycemia: Secondary | ICD-10-CM | POA: Diagnosis not present

## 2023-03-11 DIAGNOSIS — Z419 Encounter for procedure for purposes other than remedying health state, unspecified: Secondary | ICD-10-CM | POA: Diagnosis not present

## 2023-03-19 DIAGNOSIS — Z6832 Body mass index (BMI) 32.0-32.9, adult: Secondary | ICD-10-CM | POA: Diagnosis not present

## 2023-03-19 DIAGNOSIS — E1165 Type 2 diabetes mellitus with hyperglycemia: Secondary | ICD-10-CM | POA: Diagnosis not present

## 2023-03-19 DIAGNOSIS — J301 Allergic rhinitis due to pollen: Secondary | ICD-10-CM | POA: Diagnosis not present

## 2023-03-19 DIAGNOSIS — E782 Mixed hyperlipidemia: Secondary | ICD-10-CM | POA: Diagnosis not present

## 2023-04-10 DIAGNOSIS — Z419 Encounter for procedure for purposes other than remedying health state, unspecified: Secondary | ICD-10-CM | POA: Diagnosis not present

## 2023-05-11 DIAGNOSIS — Z419 Encounter for procedure for purposes other than remedying health state, unspecified: Secondary | ICD-10-CM | POA: Diagnosis not present

## 2023-05-19 IMAGING — US US ABDOMEN COMPLETE
1 series · 14 of 25 positions shown · non-contrast
Comparison: 12/09/2018

CLINICAL DATA: Elevated liver enzymes

EXAM:
ABDOMEN ULTRASOUND COMPLETE

[Series 1: us abdomen complete · 0.21mm/px · 14 of 123 slices shown]
[im 1/123]
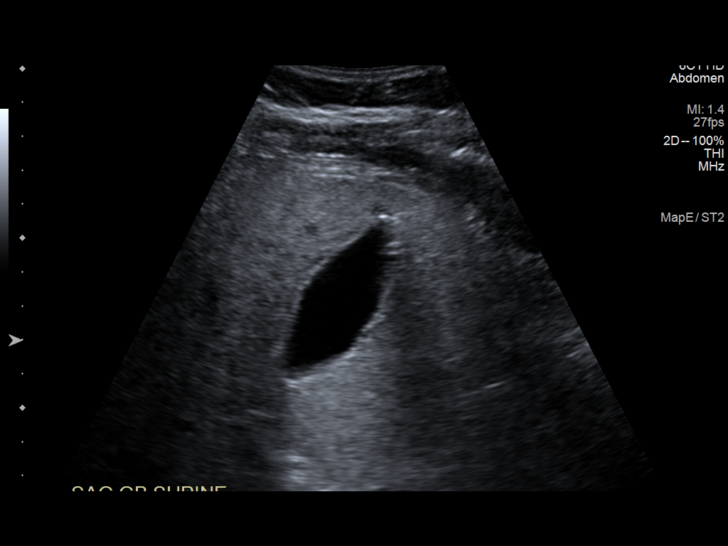
[im 11/123]
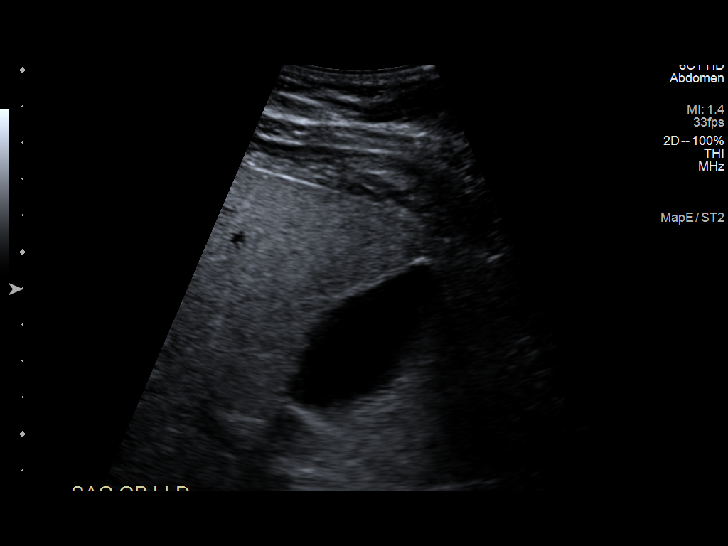
[im 21/123]
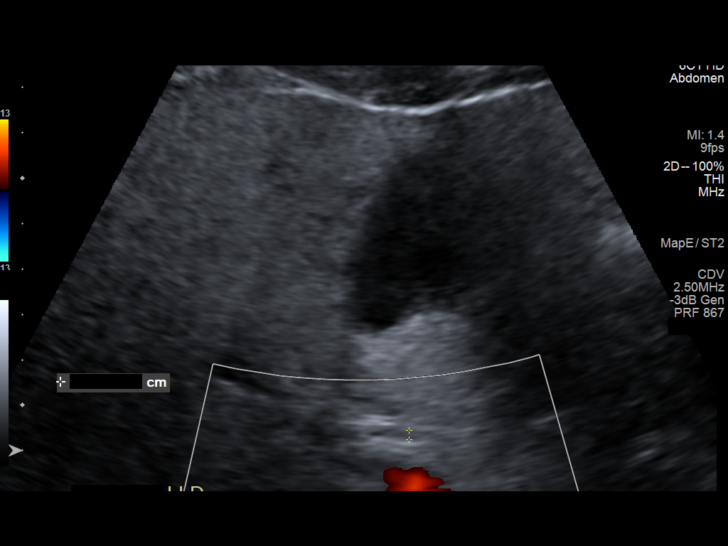
[im 31/123]
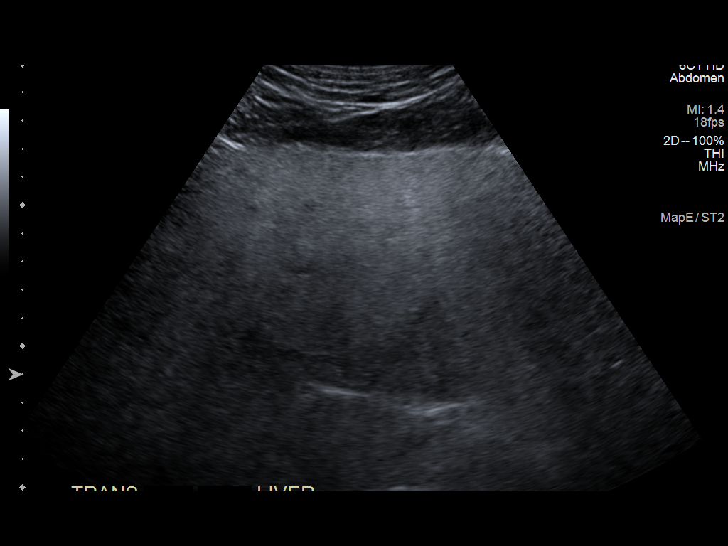
[im 41/123]
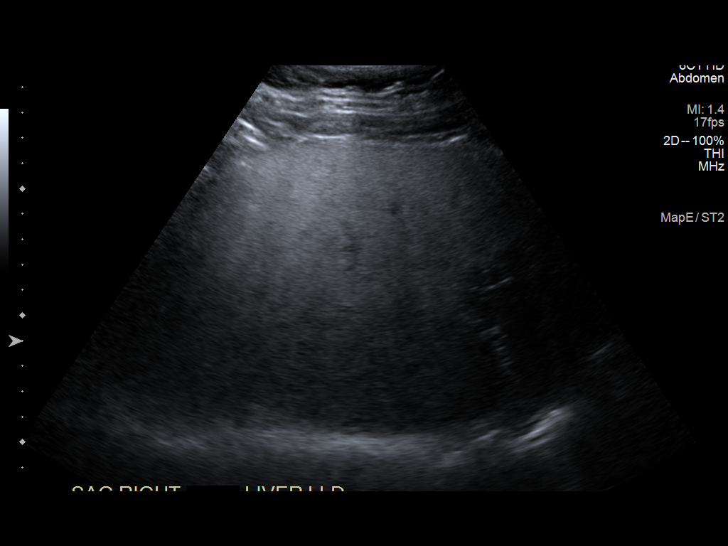
[im 46/123]
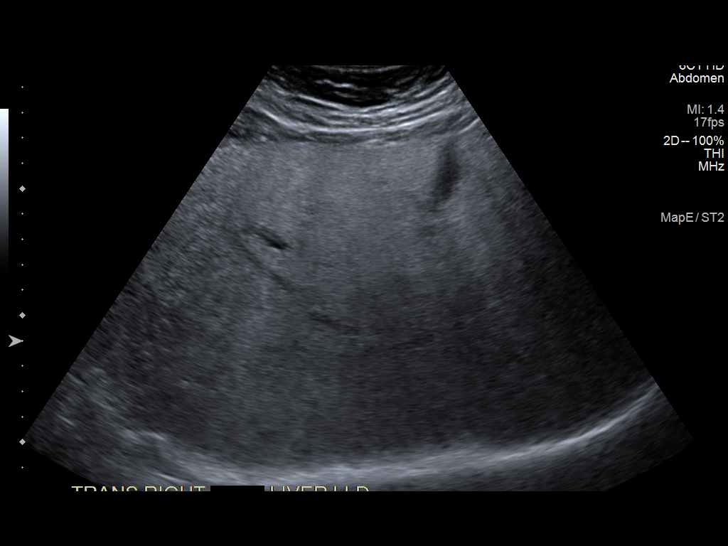
[im 56/123]
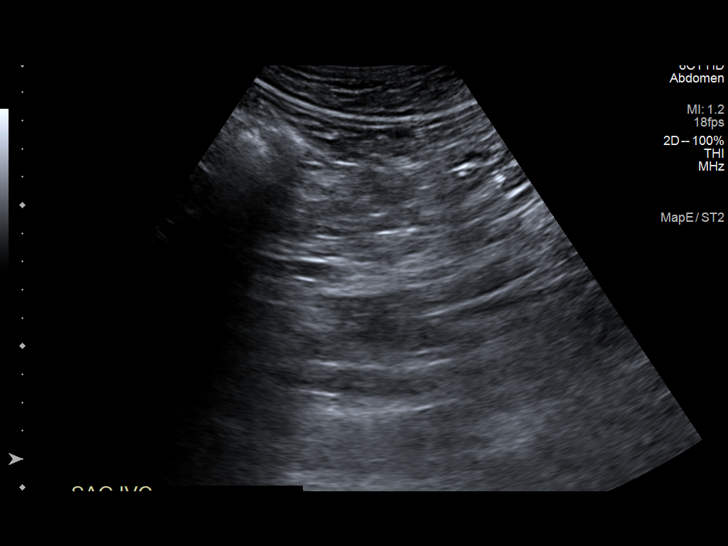
[im 67/123]
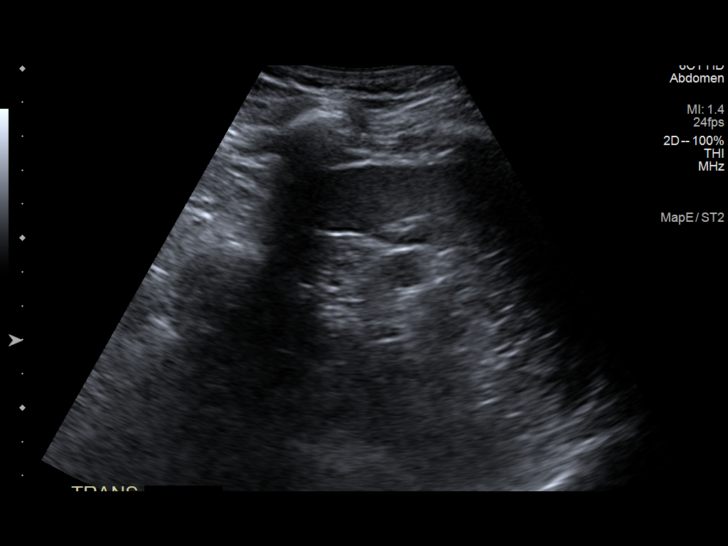
[im 77/123]
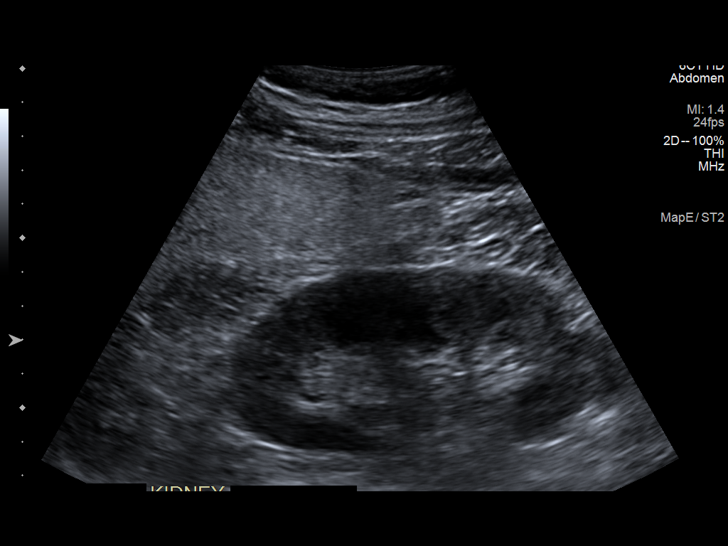
[im 82/123]
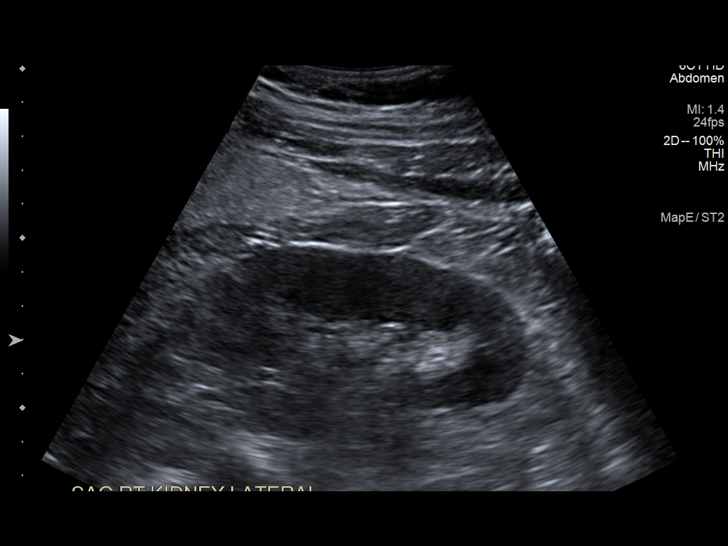
[im 92/123]
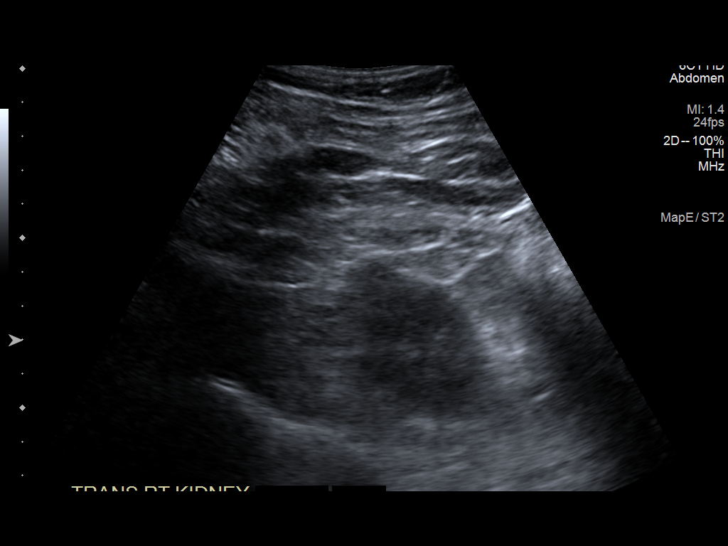
[im 102/123]
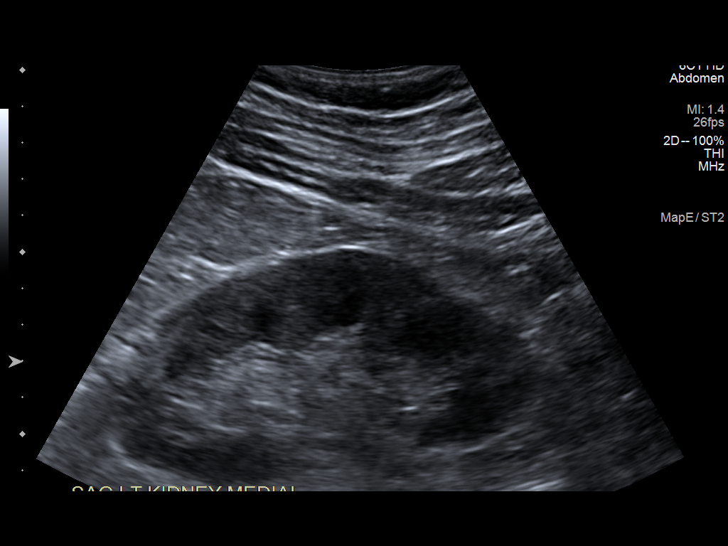
[im 112/123]
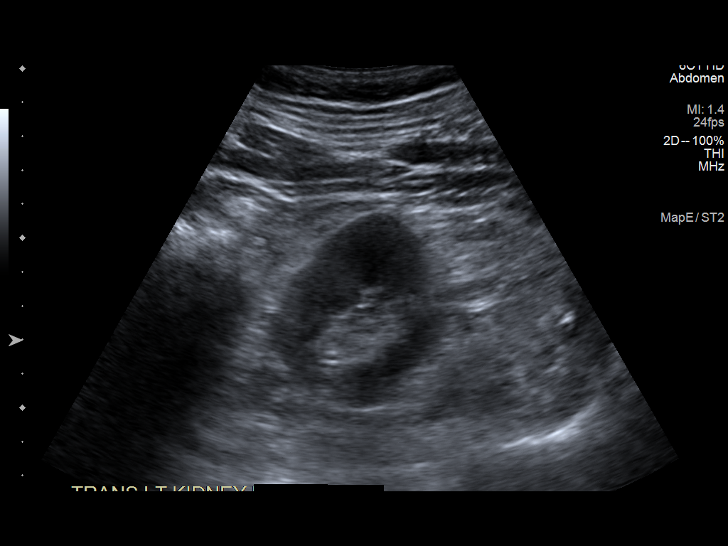
[im 123/123]
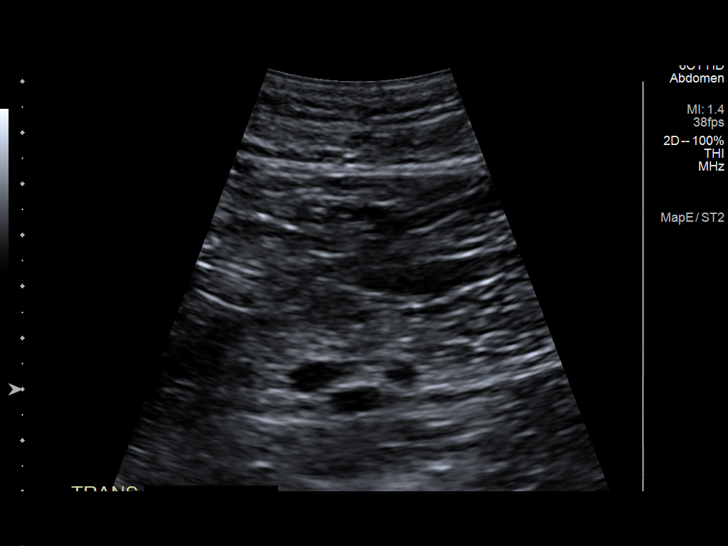

[14 of 25 positions shown; findings below may reference images not displayed]

FINDINGS: Gallbladder: No gallstones or wall thickening visualized. No
sonographic Murphy sign noted by sonographer.

Common bile duct: Diameter: 2 mm.  Normal.

Liver: Slightly increased echogenicity suggesting fatty change. No
focal lesion. Portal vein is patent on color Doppler imaging with
normal direction of blood flow towards the liver.

IVC: No abnormality visualized.

Pancreas: Poorly seen because of overlying bowel gas.

Spleen: Size and appearance within normal limits.

Right Kidney: Length: 11.0 cm. Echogenicity within normal limits. No
mass or hydronephrosis visualized.

Left Kidney: Length: 11.7 cm. Echogenicity within normal limits. No
mass or hydronephrosis visualized.

Abdominal aorta: No aneurysm visualized.

Other findings: No visible ascites.
IMPRESSION: Increased echogenicity of the liver parenchyma consistent with
steatosis. No focal lesion or ductal dilatation. Normal appearing
gallbladder.

Pancreas poorly seen because of overlying bowel gas.

## 2023-06-10 DIAGNOSIS — Z419 Encounter for procedure for purposes other than remedying health state, unspecified: Secondary | ICD-10-CM | POA: Diagnosis not present

## 2023-07-11 DIAGNOSIS — Z419 Encounter for procedure for purposes other than remedying health state, unspecified: Secondary | ICD-10-CM | POA: Diagnosis not present

## 2023-08-11 DIAGNOSIS — Z419 Encounter for procedure for purposes other than remedying health state, unspecified: Secondary | ICD-10-CM | POA: Diagnosis not present

## 2023-08-20 DIAGNOSIS — J069 Acute upper respiratory infection, unspecified: Secondary | ICD-10-CM | POA: Diagnosis not present

## 2023-09-03 DIAGNOSIS — R0982 Postnasal drip: Secondary | ICD-10-CM | POA: Diagnosis not present

## 2023-09-03 DIAGNOSIS — R053 Chronic cough: Secondary | ICD-10-CM | POA: Diagnosis not present

## 2023-09-08 DIAGNOSIS — Z419 Encounter for procedure for purposes other than remedying health state, unspecified: Secondary | ICD-10-CM | POA: Diagnosis not present

## 2023-09-18 DIAGNOSIS — J302 Other seasonal allergic rhinitis: Secondary | ICD-10-CM | POA: Diagnosis not present

## 2023-09-18 DIAGNOSIS — K649 Unspecified hemorrhoids: Secondary | ICD-10-CM | POA: Diagnosis not present

## 2023-10-12 DIAGNOSIS — E782 Mixed hyperlipidemia: Secondary | ICD-10-CM | POA: Diagnosis not present

## 2023-10-12 DIAGNOSIS — E1165 Type 2 diabetes mellitus with hyperglycemia: Secondary | ICD-10-CM | POA: Diagnosis not present

## 2023-10-12 DIAGNOSIS — Z6832 Body mass index (BMI) 32.0-32.9, adult: Secondary | ICD-10-CM | POA: Diagnosis not present

## 2023-10-12 DIAGNOSIS — Z125 Encounter for screening for malignant neoplasm of prostate: Secondary | ICD-10-CM | POA: Diagnosis not present

## 2023-10-12 DIAGNOSIS — J301 Allergic rhinitis due to pollen: Secondary | ICD-10-CM | POA: Diagnosis not present

## 2023-10-20 DIAGNOSIS — Z419 Encounter for procedure for purposes other than remedying health state, unspecified: Secondary | ICD-10-CM | POA: Diagnosis not present

## 2023-11-19 DIAGNOSIS — Z419 Encounter for procedure for purposes other than remedying health state, unspecified: Secondary | ICD-10-CM | POA: Diagnosis not present

## 2023-12-20 DIAGNOSIS — Z419 Encounter for procedure for purposes other than remedying health state, unspecified: Secondary | ICD-10-CM | POA: Diagnosis not present

## 2024-01-19 DIAGNOSIS — Z419 Encounter for procedure for purposes other than remedying health state, unspecified: Secondary | ICD-10-CM | POA: Diagnosis not present

## 2024-02-19 DIAGNOSIS — Z419 Encounter for procedure for purposes other than remedying health state, unspecified: Secondary | ICD-10-CM | POA: Diagnosis not present

## 2024-03-02 DIAGNOSIS — N481 Balanitis: Secondary | ICD-10-CM | POA: Diagnosis not present

## 2024-03-21 DIAGNOSIS — Z419 Encounter for procedure for purposes other than remedying health state, unspecified: Secondary | ICD-10-CM | POA: Diagnosis not present
